# Patient Record
Sex: Male | Born: 2000 | Race: Black or African American | Hispanic: No | Marital: Single | Smoking: Never smoker
Health system: Southern US, Community
[De-identification: ages and names within clinical notes are randomized; demographics above are authoritative.]

## PROBLEM LIST (undated history)

## (undated) ENCOUNTER — Ambulatory Visit (HOSPITAL_COMMUNITY): Admission: EM | Payer: Medicaid Other | Source: Home / Self Care

## (undated) DIAGNOSIS — J45909 Unspecified asthma, uncomplicated: Secondary | ICD-10-CM

## (undated) HISTORY — PX: WISDOM TOOTH EXTRACTION: SHX21

---
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
## 2019-07-30 IMAGING — CR DG CHEST 2V
2 series · 2 of 2 positions shown · non-contrast
Comparison: 07/11/2012 chest radiograph.

CLINICAL DATA: 17 y/o M; central chest pain and shortness of
breath.

EXAM:
CHEST - 2 VIEW

[w chest pa]
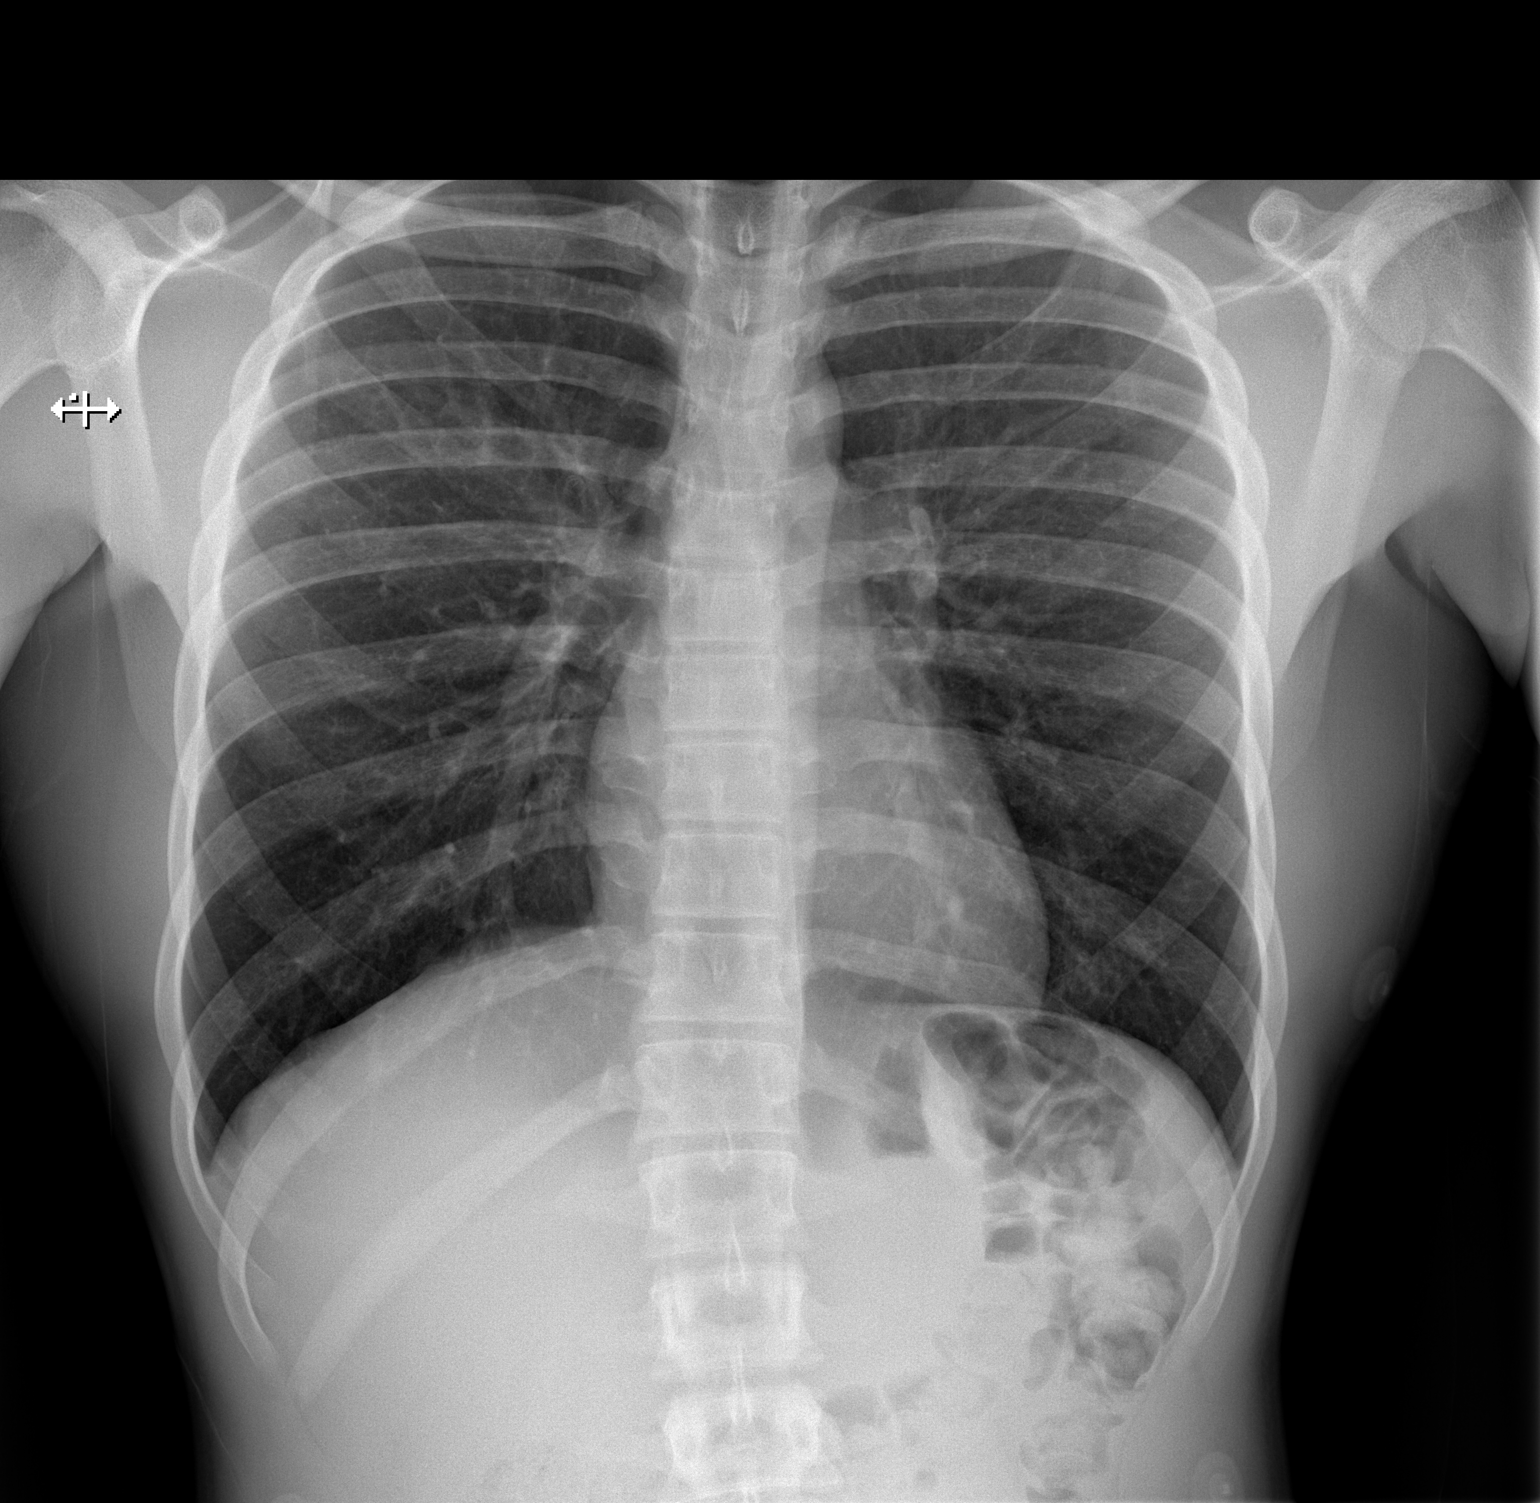

[w chest lat]
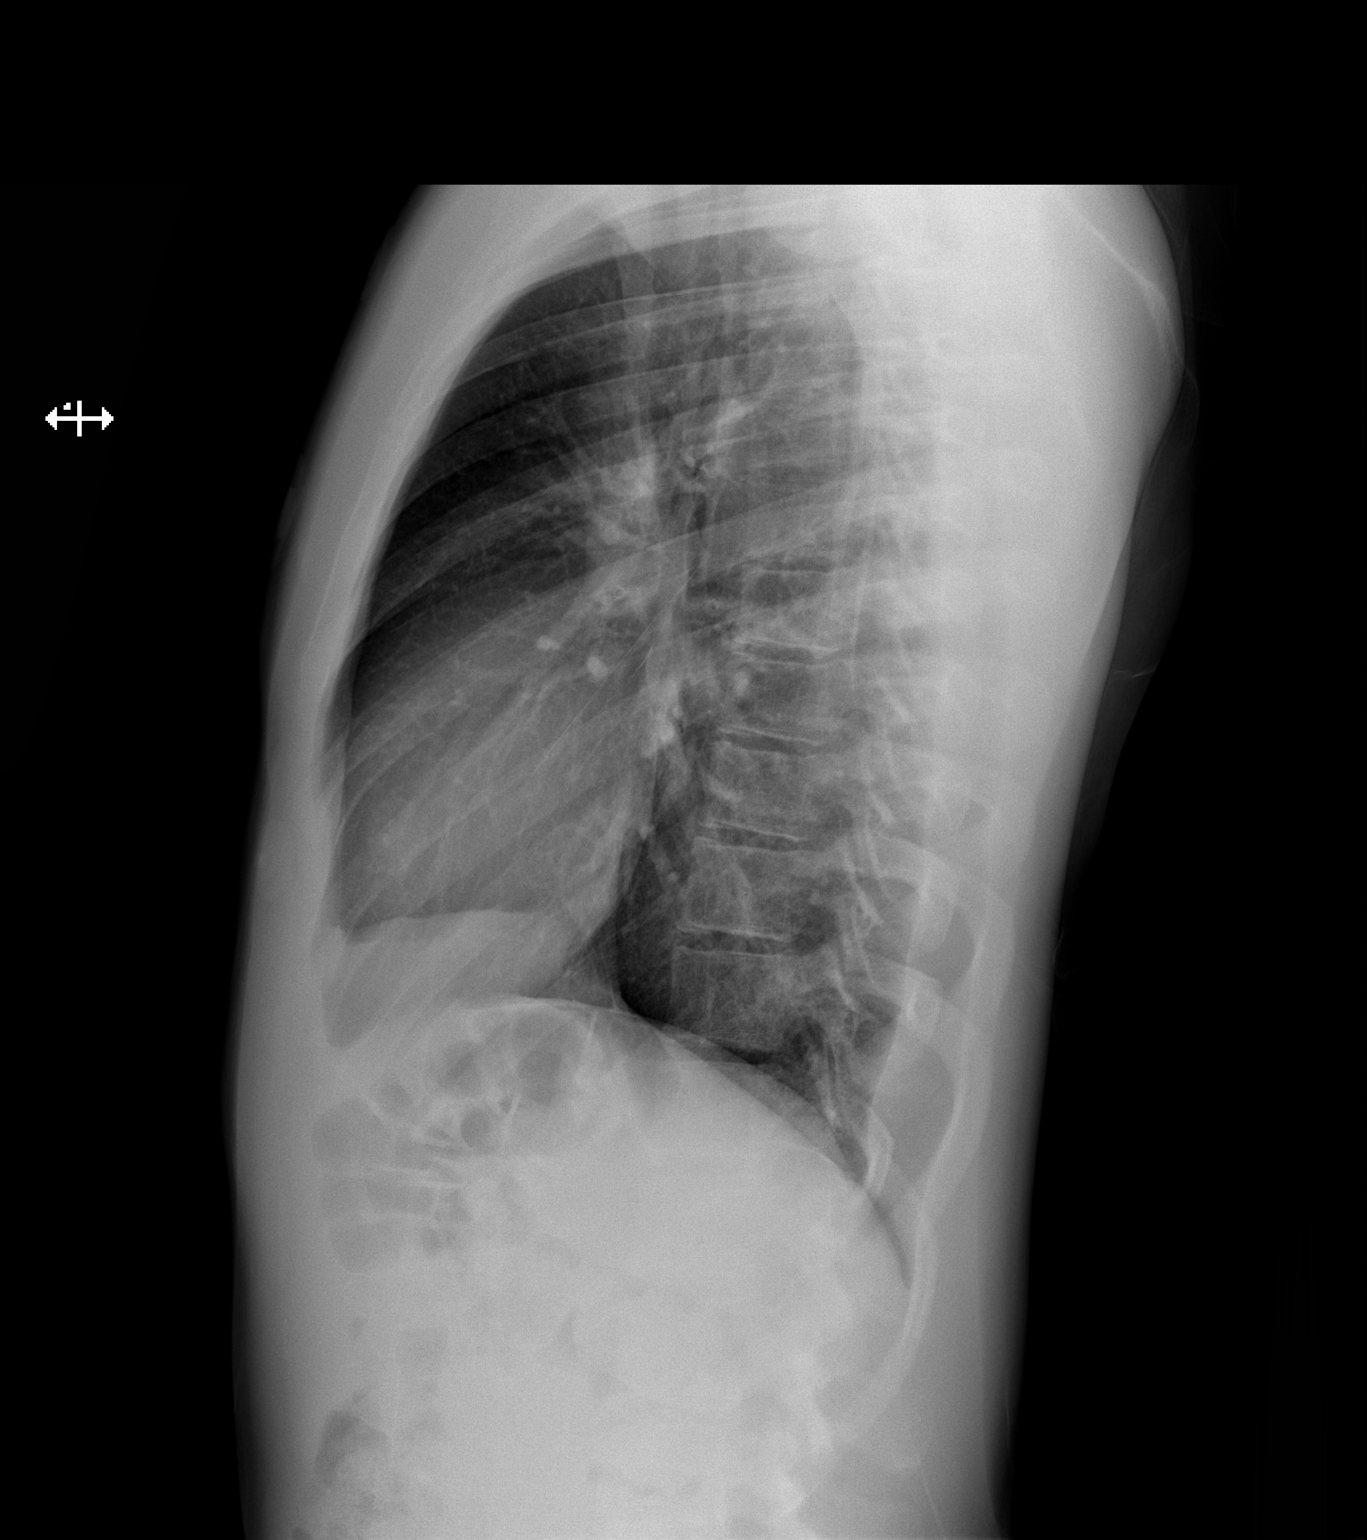

[2 of 2 positions shown; findings below may reference images not displayed]

FINDINGS: Stable heart size and mediastinal contours are within normal limits.
Both lungs are clear. The visualized skeletal structures are
unremarkable.
IMPRESSION: No acute pulmonary process identified.

## 2022-11-17 ENCOUNTER — Other Ambulatory Visit: Payer: Self-pay

## 2022-11-17 ENCOUNTER — Emergency Department (HOSPITAL_COMMUNITY)
Admission: EM | Admit: 2022-11-17 | Discharge: 2022-11-18 | Disposition: A | Discharge status: Home / Self Care | Payer: Medicaid Other | Attending: Emergency Medicine | Admitting: Emergency Medicine

## 2022-11-17 ENCOUNTER — Encounter (HOSPITAL_COMMUNITY): Payer: Self-pay | Admitting: Emergency Medicine

## 2022-11-17 ENCOUNTER — Emergency Department (HOSPITAL_COMMUNITY): Payer: Medicaid Other

## 2022-11-17 DIAGNOSIS — R0602 Shortness of breath: Secondary | ICD-10-CM | POA: Diagnosis not present

## 2022-11-17 DIAGNOSIS — J45901 Unspecified asthma with (acute) exacerbation: Secondary | ICD-10-CM | POA: Diagnosis not present

## 2022-11-17 DIAGNOSIS — R062 Wheezing: Secondary | ICD-10-CM | POA: Diagnosis present

## 2022-11-17 MED ORDER — IPRATROPIUM-ALBUTEROL 0.5-2.5 (3) MG/3ML IN SOLN
RESPIRATORY_TRACT | Status: AC
  Administered 2022-11-17: 3 mL via RESPIRATORY_TRACT
  Filled 2022-11-17: qty 3

## 2022-11-17 MED ORDER — ACETAMINOPHEN 500 MG PO TABS
ORAL_TABLET | ORAL | Status: AC
  Administered 2022-11-17: 1000 mg via ORAL
  Filled 2022-11-17: qty 2

## 2022-11-17 MED ORDER — ALBUTEROL SULFATE HFA 108 (90 BASE) MCG/ACT IN AERS
2.0000 | INHALATION_SPRAY | RESPIRATORY_TRACT | Status: DC | PRN
  Administered 2022-11-17: 2 via RESPIRATORY_TRACT
  Filled 2022-11-17: qty 6.7

## 2022-11-17 MED ORDER — PREDNISONE 20 MG PO TABS: 40.0000 mg | ORAL_TABLET | Freq: Every day | ORAL | 0 refills | Status: DC

## 2022-11-17 MED ORDER — METHYLPREDNISOLONE SODIUM SUCC 125 MG IJ SOLR: 125.0000 mg | Freq: Once | INTRAMUSCULAR | Status: AC

## 2022-11-17 MED ORDER — METHYLPREDNISOLONE SODIUM SUCC 125 MG IJ SOLR
INTRAMUSCULAR | Status: AC
  Administered 2022-11-17: 125 mg via INTRAVENOUS
  Filled 2022-11-17: qty 2

## 2022-11-17 MED ORDER — ACETAMINOPHEN 500 MG PO TABS: 1000.0000 mg | ORAL_TABLET | Freq: Once | ORAL | Status: AC

## 2022-11-17 MED ORDER — IPRATROPIUM-ALBUTEROL 0.5-2.5 (3) MG/3ML IN SOLN: 3.0000 mL | Freq: Once | RESPIRATORY_TRACT | Status: AC

## 2022-11-17 MED ORDER — IPRATROPIUM-ALBUTEROL 0.5-2.5 (3) MG/3ML IN SOLN
3.0000 mL | Freq: Once | RESPIRATORY_TRACT | Status: AC
  Administered 2022-11-17: 3 mL via RESPIRATORY_TRACT
  Filled 2022-11-17: qty 3

## 2022-11-17 NOTE — ED Triage Notes (Signed)
Pt c/o shob since last night. Pt has hx of asthma does not have an inhaler.

## 2022-11-17 NOTE — ED Provider Notes (Signed)
[facility]-EMERGENCY DEPT [facility] Emergency Department Provider Note MRN:  [id number]  Arrival date & time: 11/17/22     Chief Complaint   Asthma   History of Present Illness   Micheal Martin is a 22 y.o. year-old male presents to the ED with chief complaint of SOB and wheezing.  States that he had some symptoms last night.  Felt ok this morning, but then began having symptoms worsening into this afternoon and evening.  He reports associated cough.  He denies fever or productive cough.    Hx of asthma.  History provided by patient.   Review of Systems  Pertinent positive and negative review of systems noted in HPI.    Physical Exam   Vitals:   11/17/22 2245 11/17/22 2320  BP: 138/80   Pulse: 85   Resp: (!) 22   Temp:  98 F (36.7 C)  SpO2: 96%     CONSTITUTIONAL:  well-appearing, NAD NEURO:  Alert and oriented x 3, CN 3-12 grossly intact EYES:  eyes equal and reactive ENT/NECK:  Supple, no stridor  CARDIO:  normal rate, regular rhythm, appears well-perfused  PULM:  No respiratory distress, diminished throughout GI/GU:  non-distended,  MSK/SPINE:  No gross deformities, no edema, moves all extremities  SKIN:  no rash, atraumatic   *Additional and/or pertinent findings included in MDM below  Diagnostic and Interventional Summary    EKG Interpretation Date/Time:    Ventricular Rate:    PR Interval:    QRS Duration:    QT Interval:    QTC Calculation:   R Axis:      Text Interpretation:         Labs Reviewed - No data to display  DG Chest Port 1 View  Final Result      Medications  albuterol (VENTOLIN HFA) 108 (90 Base) MCG/ACT inhaler 2 puff (2 puffs Inhalation Given 11/17/22 1932)  methylPREDNISolone sodium succinate (SOLU-MEDROL) 125 mg/2 mL injection 125 mg (125 mg Intravenous Given 11/17/22 2244)  ipratropium-albuterol (DUONEB) 0.5-2.5 (3) MG/3ML nebulizer solution 3 mL (3 mLs Nebulization Given 11/17/22 2244)  acetaminophen (TYLENOL) tablet  1,000 mg (1,000 mg Oral Given 11/17/22 2244)  ipratropium-albuterol (DUONEB) 0.5-2.5 (3) MG/3ML nebulizer solution 3 mL (3 mLs Nebulization Given 11/17/22 2330)     Procedures  /  Critical Care Procedures  ED Course and Medical Decision Making  I have reviewed the triage vital signs, the nursing notes, and pertinent available records from the EMR.  Social Determinants Affecting Complexity of Care: Patient has no clinically significant social determinants affecting this chief complaint..   ED Course:    Medical Decision Making Patient here with wheezing and symptoms that seem consistent with asthma exacerbation.  No productive cough or fever.   Will give nebs and steroids.  Patient is improved after 2 rounds of nebs and solumedrol.   Rx for prednisone and albuterol.  Amount and/or Complexity of Data Reviewed Radiology: ordered.  Risk OTC drugs. Prescription drug management.         Consultants: No consultations were needed in caring for this patient.   Treatment and Plan: I considered admission due to patient's initial presentation, but after considering the examination and diagnostic results, patient will not require admission and can be discharged with outpatient follow-up.    Final Clinical Impressions(s) / ED Diagnoses     ICD-10-CM   1. Exacerbation of asthma, unspecified asthma severity, unspecified whether persistent  J45.901       ED Discharge Orders  Ordered    predniSONE (DELTASONE) 20 MG tablet  Daily        11/17/22 2342              Discharge Instructions Discussed with and Provided to Patient:   Discharge Instructions   None      Roxy Horseman, PA-C 11/17/22 2344    Charlynne Pander, MD 11/18/22 1018

## 2023-08-27 DIAGNOSIS — R519 Headache, unspecified: Secondary | ICD-10-CM | POA: Diagnosis not present

## 2023-08-27 DIAGNOSIS — J4520 Mild intermittent asthma, uncomplicated: Secondary | ICD-10-CM | POA: Diagnosis not present

## 2023-09-16 DIAGNOSIS — J4520 Mild intermittent asthma, uncomplicated: Secondary | ICD-10-CM | POA: Diagnosis not present

## 2023-09-16 DIAGNOSIS — Z0000 Encounter for general adult medical examination without abnormal findings: Secondary | ICD-10-CM | POA: Diagnosis not present

## 2023-12-16 DIAGNOSIS — J4520 Mild intermittent asthma, uncomplicated: Secondary | ICD-10-CM | POA: Diagnosis not present

## 2024-03-01 ENCOUNTER — Encounter: Payer: Self-pay | Admitting: Allergy

## 2024-03-01 ENCOUNTER — Ambulatory Visit: Payer: Self-pay | Admitting: Allergy

## 2024-03-01 ENCOUNTER — Other Ambulatory Visit: Payer: Self-pay

## 2024-03-01 VITALS — BP 122/78 | HR 72 | Temp 99.2°F | Ht 66.0 in | Wt 142.2 lb

## 2024-03-01 DIAGNOSIS — H1013 Acute atopic conjunctivitis, bilateral: Secondary | ICD-10-CM | POA: Diagnosis not present

## 2024-03-01 DIAGNOSIS — Z886 Allergy status to analgesic agent status: Secondary | ICD-10-CM

## 2024-03-01 DIAGNOSIS — J3089 Other allergic rhinitis: Secondary | ICD-10-CM

## 2024-03-01 DIAGNOSIS — J4540 Moderate persistent asthma, uncomplicated: Secondary | ICD-10-CM

## 2024-03-01 MED ORDER — BUDESONIDE-FORMOTEROL FUMARATE 80-4.5 MCG/ACT IN AERO: 2.0000 | INHALATION_SPRAY | Freq: Two times a day (BID) | RESPIRATORY_TRACT | 5 refills | Status: AC

## 2024-03-01 MED ORDER — ALBUTEROL SULFATE HFA 108 (90 BASE) MCG/ACT IN AERS: 2.0000 | INHALATION_SPRAY | RESPIRATORY_TRACT | 1 refills | Status: AC | PRN

## 2024-03-01 MED ORDER — FLUTICASONE PROPIONATE 50 MCG/ACT NA SUSP: 1.0000 | Freq: Every day | NASAL | 5 refills | Status: AC | PRN

## 2024-03-01 NOTE — Progress Notes (Signed)
 "  New Patient Note  RE: Micheal Martin MRN: [id number] DOB: 05-Jan-2001 Date of Office Visit: 03/01/2024  Consult requested by: Perri Starleen BROCKS, MD Primary care provider: System, Provider Not In  Chief Complaint: Asthma (Using 2 inhalers, that are working ok) and Allergy Testing (Requesting to be tested)  History of Present Illness: I had the pleasure of seeing Micheal Martin for initial evaluation at the [facility] on 03/01/2024. He is a 24 y.o. male, who is referred here by Perri Starleen BROCKS, MD for the evaluation of asthma.  Discussed the use of AI scribe software for clinical note transcription with the patient, who gave verbal consent to proceed.    He has experienced asthma symptoms for approximately 20 years, with recent exacerbations that are now improving. Symptoms include shortness of breath, wheezing, and occasional chest tightness, particularly with weather changes or exposure to dust. No nocturnal symptoms. He uses Symbicort  80mcg twice daily, which he started last year, and finds it helpful. Albuterol  is used as a rescue inhaler about once a week and is effective. He does not use a spacer with his inhaler. No prednisone  or other steroids have been required in the past year, and there have been no hospitalizations. No ER visits for asthma in the past year.  He reports symptoms such as rhinorrhea, nasal congestion, itchy and watery eyes, and sneezing, particularly in the spring and around this time of year. He does not take any medications for these symptoms and has not undergone allergy testing before. Flonase  is used once a day for nasal symptoms.  He has a past allergic reaction to ibuprofen , which caused throat swelling in 2021, lasting a few hours. He avoids NSAIDs and uses Tylenol  without issues. No food allergies, though he recalls a throat reaction after eating at [employer] once. No issues with bee stings, insect stings, hives, or eczema.  He works as an  engineer, maintenance and does not find that fumes or gases at work significantly affect his asthma. He has a dog at home for three years, which does not seem to bother him. No smoking, heartburn, reflux, frequent infections, or recent fevers or chills. Normal eating and bathroom habits and no rashes.      He reports symptoms of  chest tightness, shortness of breath, wheezing, for 20+ years. Current medications include Symbicort  80mcg 2 puffs BID and albuterol  prn which help. He reports not using aerochamber with inhalers. Main triggers are weather changes and dust.  In the last month, frequency of symptoms: 1x/week. Frequency of nocturnal symptoms: 0x/month. Frequency of SABA use: 1x/week. Interference with physical activity: sometimes. Sleep is undisturbed. In the last 12 months, emergency room visits/urgent care visits/doctor office visits or hospitalizations due to respiratory issues: 2. In the last 12 months, oral steroids courses: no. Lifetime history of hospitalization for respiratory issues: no. Prior intubations: no. History of pneumonia: no. He was not evaluated by allergist/pulmonologist in the past. Smoking exposure: denies.  History of reflux: denies.  12/16/2023 PCP visit: Assessment and Plan  1. Mild intermittent asthma without complication Comments: Interested in more information about his allergies - REFERRAL TO ALLERGY CLINIC   Assessment and Plan: Micheal Martin is a 24 y.o. male with: Moderate persistent asthma without complication Symptoms improved with current treatment. No recent ER visits or systemic steroids. Current regimen effective. Normal spirometry today.  Daily controller medication(s): continue Symbicort  80mcg 2 puffs twice a day rinse mouth after each use.  May use albuterol  rescue inhaler 2  puffs every 4 to 6 hours as needed for shortness of breath, chest tightness, coughing, and wheezing. May use albuterol  rescue inhaler 2 puffs 5 to 15 minutes prior to strenuous physical  activities. Monitor frequency of use - if you need to use it more than twice per week on a consistent basis let us  know.   Other allergic rhinitis Allergic conjunctivitis of both eyes Symptoms seasonal, particularly in spring. No current oral antihistamines. Flonase  used daily. No prior allergy testing or ENT consultation. Return for allergy skin testing. Will make additional recommendations based on results. If significant positives will recommend allergy injections - handout given. Use Flonase  (fluticasone ) nasal spray 1-2 sprays per nostril once a day as needed for nasal congestion.  Nasal saline spray (i.e., Simply Saline) or nasal saline lavage (i.e., NeilMed) is recommended as needed and prior to medicated nasal sprays.  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 Continue to avoid NSAIDs.  Return for Skin testing.  Meds ordered this encounter  Medications   budesonide -formoterol  (SYMBICORT ) 80-4.5 MCG/ACT inhaler    Sig: Inhale 2 puffs into the lungs in the morning and at bedtime. Rinse mouth after each use.    Dispense:  1 each    Refill:  5   albuterol  (VENTOLIN  HFA) 108 (90 Base) MCG/ACT inhaler    Sig: Inhale 2 puffs into the lungs every 4 (four) hours as needed for wheezing or shortness of breath (coughing fits).    Dispense:  18 g    Refill:  1   fluticasone  (FLONASE ) 50 MCG/ACT nasal spray    Sig: Place 1-2 sprays into both nostrils daily as needed (nasal congestion).    Dispense:  16 g    Refill:  5   Lab Orders  No laboratory test(s) ordered today    Other allergy screening: Rhino conjunctivitis: yes Usually symptomatic in the winter and spring. No prior antihistamines, no prior allergy testing or ENT evaluation.   Food allergy: no Medication allergy: yes  Hymenoptera allergy: no Urticaria: no Eczema:no History of recurrent infections suggestive of immunodeficency: no  Diagnostics: Spirometry:  Tracings reviewed. His  effort: Good reproducible efforts. FVC: 3.94L FEV1: 3.40L, 97% predicted FEV1/FVC ratio: 86% Interpretation: Spirometry consistent with normal pattern.  Please see scanned spirometry results for details.  Results discussed with patient/family.   Past Medical History: There are no active problems to display for this patient.  Past Medical History:  Diagnosis Date   Asthma    Past Surgical History: Past Surgical History:  Procedure Laterality Date   WISDOM TOOTH EXTRACTION     Medication List:  Current Outpatient Medications  Medication Sig Dispense Refill   albuterol  (VENTOLIN  HFA) 108 (90 Base) MCG/ACT inhaler Inhale 2 puffs into the lungs every 4 (four) hours as needed for wheezing or shortness of breath (coughing fits). 18 g 1   budesonide -formoterol  (SYMBICORT ) 80-4.5 MCG/ACT inhaler Inhale 2 puffs into the lungs in the morning and at bedtime. Rinse mouth after each use. 1 each 5   Cholecalciferol (D 5000) 125 MCG (5000 UT) capsule Take 5,000 Units by mouth daily.     fluticasone  (FLONASE ) 50 MCG/ACT nasal spray Place 1-2 sprays into both nostrils daily as needed (nasal congestion). 16 g 5   cetirizine (ZYRTEC) 10 MG tablet Take 10 mg by mouth daily.     No current facility-administered medications for this visit.   Allergies: Allergies[1] Social History: Social History   Socioeconomic History   Marital status: Single  Spouse name: Not on file   Number of children: Not on file   Years of education: Not on file   Highest education level: Not on file  Occupational History   Not on file  Tobacco Use   Smoking status: Never   Smokeless tobacco: Never  Vaping Use   Vaping status: Never Used  Substance and Sexual Activity   Alcohol use: No   Drug use: No   Sexual activity: Never  Other Topics Concern   Not on file  Social History Narrative   Not on file   Social Drivers of Health   Tobacco Use: Low Risk (03/01/2024)   Patient History    Smoking Tobacco  Use: Never    Smokeless Tobacco Use: Never    Passive Exposure: Not on file  Financial Resource Strain: Not on file  Food Insecurity: Not at Risk (09/16/2023)   Received from [employer]    Within the past 12 months, the food you bought just didn't last and you didn't have enough money to get more.: 1  Transportation Needs: Not at Risk (09/16/2023)   Received from [employer] Needs    In the past 12 months, has lack of transportation kept you from medical appointments, meetings, work or from getting things needed for daily living? (Check all that apply): 1  Physical Activity: Not on file  Stress: Not on file  Social Connections: Not on file  Depression (EYV7-0): Not on file  Alcohol Screen: Not on file  Housing: Not on file  Utilities: Not on file  Health Literacy: Not on file   Lives in a house. Smoking: denies Occupation: Event Organiser HistorySurveyor, Minerals in the house: no Engineer, Civil (consulting) in the family room: yes Carpet in the bedroom: yes Heating: electric Cooling: central Pet: yes 1 dog x 3 yrs  Family History: Family History  Problem Relation Age of Onset   Asthma Mother    Healthy Father    Asthma Brother    Diabetes type II Maternal Grandfather    Hypertension Other    Diabetes Other    Asthma Other    Stroke Other    Review of Systems  Constitutional:  Negative for appetite change, chills, fever and unexpected weight change.  HENT:  Negative for congestion and rhinorrhea.   Eyes:  Negative for itching.  Respiratory:  Negative for cough, chest tightness, shortness of breath and wheezing.   Cardiovascular:  Negative for chest pain.  Gastrointestinal:  Negative for abdominal pain.  Genitourinary:  Negative for difficulty urinating.  Skin:  Negative for rash.  Neurological:  Negative for headaches.    Objective: BP 122/78 (BP Location: Right Arm, Patient Position: Sitting, Cuff Size: Normal)   Pulse 72   Temp 99.2 F  (37.3 C) (Temporal)   Ht 5' 6 (1.676 m)   Wt 142 lb 3.2 oz (64.5 kg)   SpO2 99%   BMI 22.95 kg/m  Body mass index is 22.95 kg/m. Physical Exam Vitals and nursing note reviewed.  Constitutional:      Appearance: Normal appearance. He is well-developed.  HENT:     Head: Normocephalic and atraumatic.     Right Ear: Tympanic membrane and external ear normal.     Left Ear: Tympanic membrane and external ear normal.     Nose: Nose normal.     Mouth/Throat:     Mouth: Mucous membranes are moist.     Pharynx: Oropharynx is clear.  Eyes:  Conjunctiva/sclera: Conjunctivae normal.  Cardiovascular:     Rate and Rhythm: Normal rate and regular rhythm.     Heart sounds: Normal heart sounds. No murmur heard.    No friction rub. No gallop.  Pulmonary:     Effort: Pulmonary effort is normal.     Breath sounds: Normal breath sounds. No wheezing, rhonchi or rales.  Musculoskeletal:     Cervical back: Neck supple.  Skin:    General: Skin is warm.     Findings: No rash.  Neurological:     Mental Status: He is alert and oriented to person, place, and time.  Psychiatric:        Behavior: Behavior normal.    The plan was reviewed with the patient/family, and all questions/concerned were addressed.  It was my pleasure to see Micheal Martin today and participate in his care. Please feel free to contact me with any questions or concerns.  Sincerely,  Orlan Cramp, DO Allergy & Immunology  [facility]  [facility] office: [phone] [facility] office: [phone]     [1]  Allergies Allergen Reactions   Ibuprofen  Swelling   "

## 2024-03-01 NOTE — Patient Instructions (Addendum)
 Rhinitis  Return for allergy skin testing. Will make additional recommendations based on results. If significant positives will recommend allergy injections - handout given. Make sure you don't take any antihistamines for 3 days before the skin testing appointment. Don't put any lotion on the back and arms on the day of testing.  Must be in good health and not ill. No vaccines/injections/antibiotics within the past 7 days.  Plan on being here for 30-60 minutes.  Use Flonase  (fluticasone ) nasal spray 1-2 sprays per nostril once a day as needed for nasal congestion.  Nasal saline spray (i.e., Simply Saline) or nasal saline lavage (i.e., NeilMed) is recommended as needed and prior to medicated nasal sprays.  Asthma Normal spirometry today.  Daily controller medication(s): continue Symbicort  80mcg 2 puffs twice a day rinse mouth after each use.  May use albuterol  rescue inhaler 2 puffs every 4 to 6 hours as needed for shortness of breath, chest tightness, coughing, and wheezing. May use albuterol  rescue inhaler 2 puffs 5 to 15 minutes prior to strenuous physical activities. Monitor frequency of use - if you need to use it more than twice per week on a consistent basis let us  know.  Breathing control goals:  Full participation in all desired activities (may need albuterol  before activity) Albuterol  use two times or less a week on average (not counting use with activity) Cough interfering with sleep two times or less a month Oral steroids no more than once a year No hospitalizations   NSAID allergy Continue to avoid.  Follow up for skin testing.

## 2024-03-08 ENCOUNTER — Encounter: Payer: Self-pay | Admitting: Allergy

## 2024-03-08 ENCOUNTER — Ambulatory Visit: Admitting: Allergy

## 2024-03-08 DIAGNOSIS — Z886 Allergy status to analgesic agent status: Secondary | ICD-10-CM

## 2024-03-08 DIAGNOSIS — J4540 Moderate persistent asthma, uncomplicated: Secondary | ICD-10-CM

## 2024-03-08 DIAGNOSIS — J3081 Allergic rhinitis due to animal (cat) (dog) hair and dander: Secondary | ICD-10-CM

## 2024-03-08 DIAGNOSIS — H1013 Acute atopic conjunctivitis, bilateral: Secondary | ICD-10-CM

## 2024-03-08 DIAGNOSIS — J3089 Other allergic rhinitis: Secondary | ICD-10-CM

## 2024-03-08 DIAGNOSIS — J301 Allergic rhinitis due to pollen: Secondary | ICD-10-CM

## 2024-03-08 DIAGNOSIS — Z91038 Other insect allergy status: Secondary | ICD-10-CM

## 2024-03-08 MED ORDER — CETIRIZINE HCL 10 MG PO TABS: 10.0000 mg | ORAL_TABLET | Freq: Two times a day (BID) | ORAL | 2 refills | Status: AC | PRN

## 2024-03-08 NOTE — Patient Instructions (Addendum)
 03/08/2024 skin testing positive to grass, ragweed, weed, trees, mold, dust mites, cat, dog, cockroach. Negative to top common foods.   Results given.  Environmental allergies Start environmental control measures as below. Use over the counter antihistamines such as Zyrtec  (cetirizine ), Claritin (loratadine), Allegra (fexofenadine), or Xyzal (levocetirizine) daily as needed. May take twice a day during allergy flares. May switch antihistamines every few months.  Use Flonase  (fluticasone ) nasal spray 1-2 sprays per nostril once a day as needed for nasal congestion.  Nasal saline spray (i.e., Simply Saline) or nasal saline lavage (i.e., NeilMed) is recommended as needed and prior to medicated nasal sprays. Recommend allergy injections. 2 injections.  Let us  know when ready to start.  Had a detailed discussion with patient/family that clinical history is suggestive of allergic rhinitis, and may benefit from allergy immunotherapy (AIT). Discussed in detail regarding the dosing, schedule, side effects (mild to moderate local allergic reaction and rarely systemic allergic reactions including anaphylaxis), and benefits (significant improvement in nasal symptoms, seasonal flares of asthma) of immunotherapy with the patient. There is significant time commitment involved with allergy shots, which includes weekly immunotherapy injections for first 9-12 months and then biweekly to monthly injections for 3-5 years.   Asthma Daily controller medication(s): continue Symbicort  80mcg 2 puffs twice a day rinse mouth after each use.  May use albuterol  rescue inhaler 2 puffs every 4 to 6 hours as needed for shortness of breath, chest tightness, coughing, and wheezing. May use albuterol  rescue inhaler 2 puffs 5 to 15 minutes prior to strenuous physical activities. Monitor frequency of use - if you need to use it more than twice per week on a consistent basis let us  know.  Breathing control goals:  Full participation  in all desired activities (may need albuterol  before activity) Albuterol  use two times or less a week on average (not counting use with activity) Cough interfering with sleep two times or less a month Oral steroids no more than once a year No hospitalizations   NSAID allergy Continue to avoid.  Return in about 4 months (around 07/06/2024). Or sooner if needed.   Reducing Pollen Exposure Pollen seasons: trees (spring), grass (summer) and ragweed/weeds (fall). Keep windows closed in your home and car to lower pollen exposure.  Install air conditioning in the bedroom and throughout the house if possible.  Avoid going out in dry windy days - especially early morning. Pollen counts are highest between 5 - 10 AM and on dry, hot and windy days.  Save outside activities for late afternoon or after a heavy rain, when pollen levels are lower.  Avoid mowing of grass if you have grass pollen allergy. Be aware that pollen can also be transported indoors on people and pets.  Dry your clothes in an automatic dryer rather than hanging them outside where they might collect pollen.  Rinse hair and eyes before bedtime. Mold Control Mold and fungi can grow on a variety of surfaces provided certain temperature and moisture conditions exist.  Outdoor molds grow on plants, decaying vegetation and soil. The major outdoor mold, Alternaria and Cladosporium, are found in very high numbers during hot and dry conditions. Generally, a late summer - fall peak is seen for common outdoor fungal spores. Rain will temporarily lower outdoor mold spore count, but counts rise rapidly when the rainy period ends. The most important indoor molds are Aspergillus and Penicillium. Dark, humid and poorly ventilated basements are ideal sites for mold growth. The next most common sites of mold growth  are the bathroom and the kitchen. Outdoor (Seasonal) Mold Control Use air conditioning and keep windows closed. Avoid exposure to decaying  vegetation. Avoid leaf raking. Avoid grain handling. Consider wearing a face mask if working in moldy areas.  Indoor (Perennial) Mold Control  Maintain humidity below 50%. Get rid of mold growth on hard surfaces with water, detergent and, if necessary, 5% bleach (do not mix with other cleaners). Then dry the area completely. If mold covers an area more than 10 square feet, consider hiring an indoor environmental professional. For clothing, washing with soap and water is best. If moldy items cannot be cleaned and dried, throw them away. Remove sources e.g. contaminated carpets. Repair and seal leaking roofs or pipes. Using dehumidifiers in damp basements may be helpful, but empty the water and clean units regularly to prevent mildew from forming. All rooms, especially basements, bathrooms and kitchens, require ventilation and cleaning to deter mold and mildew growth. Avoid carpeting on concrete or damp floors, and storing items in damp areas. Control of House Dust Mite Allergen Dust mite allergens are a common trigger of allergy and asthma symptoms. While they can be found throughout the house, these microscopic creatures thrive in warm, humid environments such as bedding, upholstered furniture and carpeting. Because so much time is spent in the bedroom, it is essential to reduce mite levels there.  Encase pillows, mattresses, and box springs in special allergen-proof fabric covers or airtight, zippered plastic covers.  Bedding should be washed weekly in hot water (130 F) and dried in a hot dryer. Allergen-proof covers are available for comforters and pillows that cant be regularly washed.  Wash the allergy-proof covers every few months. Minimize clutter in the bedroom. Keep pets out of the bedroom.  Keep humidity less than 50% by using a dehumidifier or air conditioning. You can buy a humidity measuring device called a hygrometer to monitor this.  If possible, replace carpets with hardwood,  linoleum, or washable area rugs. If that's not possible, vacuum frequently with a vacuum that has a HEPA filter. Remove all upholstered furniture and non-washable window drapes from the bedroom. Remove all non-washable stuffed toys from the bedroom.  Wash stuffed toys weekly. Pet Allergen Avoidance: Contrary to popular opinion, there are no hypoallergenic breeds of dogs or cats. That is because people are not allergic to an animals hair, but to an allergen found in the animal's saliva, dander (dead skin flakes) or urine. Pet allergy symptoms typically occur within minutes. For some people, symptoms can build up and become most severe 8 to 12 hours after contact with the animal. People with severe allergies can experience reactions in public places if dander has been transported on the pet owners clothing. Keeping an animal outdoors is only a partial solution, since homes with pets in the yard still have higher concentrations of animal allergens. Before getting a pet, ask your allergist to determine if you are allergic to animals. If your pet is already considered part of your family, try to minimize contact and keep the pet out of the bedroom and other rooms where you spend a great deal of time. As with dust mites, vacuum carpets often or replace carpet with a hardwood floor, tile or linoleum. High-efficiency particulate air (HEPA) cleaners can reduce allergen levels over time. While dander and saliva are the source of cat and dog allergens, urine is the source of allergens from rabbits, hamsters, mice and guinea pigs; so ask a non-allergic family member to clean the animals cage.  If you have a pet allergy, talk to your allergist about the potential for allergy immunotherapy (allergy shots). This strategy can often provide long-term relief. Cockroach Allergen Avoidance Cockroaches are often found in the homes of densely populated urban areas, schools or commercial buildings, but these creatures can  lurk almost anywhere. This does not mean that you have a dirty house or living area. Block all areas where roaches can enter the home. This includes crevices, wall cracks and windows.  Cockroaches need water to survive, so fix and seal all leaky faucets and pipes. Have an exterminator go through the house when your family and pets are gone to eliminate any remaining roaches. Keep food in lidded containers and put pet food dishes away after your pets are done eating. Vacuum and sweep the floor after meals, and take out garbage and recyclables. Use lidded garbage containers in the kitchen. Wash dishes immediately after use and clean under stoves, refrigerators or toasters where crumbs can accumulate. Wipe off the stove and other kitchen surfaces and cupboards regularly.

## 2024-03-08 NOTE — Progress Notes (Signed)
 "  Skin testing note  RE: Micheal Martin MRN: [id number] DOB: 02-03-2001 Date of Office Visit: 03/08/2024  Referring provider: No ref. provider found Primary care provider: Buren Annabella PARAS, FNP  Chief Complaint: skin testing  History of Present Illness: I had the pleasure of seeing Micheal Martin for a skin testing visit at the [facility] on 03/08/2024. He is a 24 y.o. male, who is being followed for allergic rhino conjunctivitis, nsaid allergy and asthma. His previous allergy office visit was on 03/01/2024 with Dr. Luke. Today is a skin testing visit.   Discussed the use of AI scribe software for clinical note transcription with the patient, who gave verbal consent to proceed.    He is currently using Symbicort , a rescue inhaler, and Flonase  for symptom management. 1 dog at home.     Assessment and Plan: Micheal Martin is a 24 y.o. male with: Other allergic rhinitis Allergic conjunctivitis of both eyes Seasonal allergic rhinitis due to pollen Allergic rhinitis due to mold Allergic rhinitis due to animal dander Allergic rhinitis due to dust mite Allergy to cockroaches Past history - Symptoms seasonal, particularly in spring. No current oral antihistamines. Flonase  used daily. No prior allergy testing or ENT consultation. 03/08/2024 skin testing positive to grass, ragweed, weed, trees, mold, dust mites, cat, dog, cockroach. Negative to top common foods.  Start environmental control measures as below. Use over the counter antihistamines such as Zyrtec  (cetirizine ), Claritin (loratadine), Allegra (fexofenadine), or Xyzal (levocetirizine) daily as needed. May take twice a day during allergy flares. May switch antihistamines every few months. Use Flonase  (fluticasone ) nasal spray 1-2 sprays per nostril once a day as needed for nasal congestion.  Nasal saline spray (i.e., Simply Saline) or nasal saline lavage (i.e., NeilMed) is recommended as needed and prior to medicated nasal  sprays. Recommend allergy injections. 2 injections.  Let us  know when ready to start.  Had a detailed discussion with patient/family that clinical history is suggestive of allergic rhinitis, and may benefit from allergy immunotherapy (AIT). Discussed in detail regarding the dosing, schedule, side effects (mild to moderate local allergic reaction and rarely systemic allergic reactions including anaphylaxis), and benefits (significant improvement in nasal symptoms, seasonal flares of asthma) of immunotherapy with the patient. There is significant time commitment involved with allergy shots, which includes weekly immunotherapy injections for first 9-12 months and then biweekly to monthly injections for 3-5 years.   Moderate persistent asthma without complication Past history - Symptoms improved with current treatment. No recent ER visits or systemic steroids. Current regimen effective. 2026 spirometry normal. Daily controller medication(s): continue Symbicort  80mcg 2 puffs twice a day rinse mouth after each use.  May use albuterol  rescue inhaler 2 puffs every 4 to 6 hours as needed for shortness of breath, chest tightness, coughing, and wheezing. May use albuterol  rescue inhaler 2 puffs 5 to 15 minutes prior to strenuous physical activities. Monitor frequency of use - if you need to use it more than twice per week on a consistent basis let us  know.   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 Continue to avoid.  Return in about 4 months (around 07/06/2024).  Meds ordered this encounter  Medications   cetirizine  (ZYRTEC  ALLERGY) 10 MG tablet    Sig: Take 1 tablet (10 mg total) by mouth 2 (two) times daily as needed for allergies.    Dispense:  60 tablet    Refill:  2   Lab Orders  No  laboratory test(s) ordered today    Diagnostics: Skin Testing: Environmental allergy panel and select foods. 03/08/2024 skin testing positive to grass, ragweed, weed,  trees, mold, dust mites, cat, dog, cockroach. Negative to top common foods.  Results discussed with patient/family.  Airborne Adult Perc - 03/08/24 0853     Time Antigen Placed 9146    Allergen Manufacturer Jestine    Location Back    Number of Test 55    1. Control-Buffer 50% Glycerol Negative    2. Control-Histamine 3+    3. Bahia 2+    4. Bermuda 2+    5. Johnson 4+    6. [place]  Blue 4+    7. Meadow Fescue 4+    8. Perennial Rye 4+    9. Timothy 2+    10. Ragweed Mix Negative    11. Cocklebur Negative    12. Plantain,  English Negative    13. Baccharis Negative    14. Dog Fennel Negative    15. Russian Thistle 2+    16. Lamb's Quarters Negative    17. Sheep Sorrell Negative    18. Rough Pigweed 2+    19. Marsh Elder, Rough Negative    20. Mugwort, Common Negative    21. Box, Elder 2+    22. Cedar, red Negative    23. Sweet Gum 2+    24. Pecan Pollen 3+    25. Pine Mix Negative    26. Walnut, Black Pollen Negative    27. Red Mulberry Negative    28. Ash Mix 2+    29. Birch Mix 2+    30. Beech American Negative    31. Cottonwood, Eastern 2+    32. Hickory, White 4+    33. Maple Mix Negative    34. Oak, Eastern Mix 2+    35. Sycamore Eastern Negative    36. Alternaria Alternata 3+    37. Cladosporium Herbarum 3+    38. Aspergillus Mix 3+    39. Penicillium Mix 2+    40. Bipolaris Sorokiniana (Helminthosporium) Negative    41. Drechslera Spicifera (Curvularia) 3+    42. Mucor Plumbeus Negative    43. Fusarium Moniliforme 4+    44. Aureobasidium Pullulans (pullulara) 4+    45. Rhizopus Oryzae Negative    46. Botrytis Cinera 2+    47. Epicoccum Nigrum 2+    48. Phoma Betae 2+    49. Dust Mite Mix Negative    50. Cat Hair 10,000 BAU/ml 3+    51.  Dog Epithelia Negative    52. Mixed Feathers Negative    53. Horse Epithelia Negative    54. Cockroach, German Negative    55. Tobacco Leaf Negative          13 Food Perc - 03/08/24 0853       Test  Information   Time Antigen Placed 9146    Allergen Manufacturer Jestine    Location Back    Number of allergen test 13      Food   1. Peanut Negative    2. Soybean Negative    3. Wheat Negative    4. Sesame Negative    5. Milk, Cow Negative    6. Casein Negative    7. Egg White, Chicken Negative    8. Shellfish Mix Negative    9. Fish Mix Negative    10. Cashew Negative    11. Walnut Food Negative    12. Almond Negative  13. Hazelnut Negative          Intradermal - 03/08/24 0920     Time Antigen Placed 0920    Allergen Manufacturer Jestine    Location Arm    Number of Test 5    Control Negative    Ragweed Mix 3+    Mite Mix 3+    Dog 3+    Cockroach 3+          Previous notes and tests were reviewed. The plan was reviewed with the patient/family, and all questions/concerned were addressed.  It was my pleasure to see Micheal Martin today and participate in his care. Please feel free to contact me with any questions or concerns.  Sincerely,  Orlan Cramp, DO Allergy & Immunology  [facility]  [facility] office: [phone] [facility] office: [phone] "

## 2024-07-05 ENCOUNTER — Ambulatory Visit: Admitting: Allergy
